# Patient Record
Sex: Male | Born: 1985
Health system: Southern US, Community
[De-identification: ages and names within clinical notes are randomized; demographics above are authoritative.]

## PROBLEM LIST (undated history)

## (undated) DIAGNOSIS — R569 Unspecified convulsions: Secondary | ICD-10-CM

## (undated) DIAGNOSIS — G4733 Obstructive sleep apnea (adult) (pediatric): Secondary | ICD-10-CM

## (undated) DIAGNOSIS — Z9989 Dependence on other enabling machines and devices: Secondary | ICD-10-CM

## (undated) DIAGNOSIS — I10 Essential (primary) hypertension: Secondary | ICD-10-CM

## (undated) HISTORY — PX: NO PAST SURGERIES: SHX2092

---

## 2018-01-18 ENCOUNTER — Encounter (HOSPITAL_COMMUNITY): Payer: Self-pay | Admitting: Neurology

## 2018-01-18 ENCOUNTER — Other Ambulatory Visit: Payer: Self-pay

## 2018-01-18 ENCOUNTER — Observation Stay (HOSPITAL_COMMUNITY)
Admission: EM | Admit: 2018-01-18 | Discharge: 2018-01-19 | Disposition: A | Discharge status: Home / Self Care | Payer: BLUE CROSS/BLUE SHIELD | Attending: Internal Medicine | Admitting: Internal Medicine

## 2018-01-18 ENCOUNTER — Observation Stay (HOSPITAL_COMMUNITY): Payer: BLUE CROSS/BLUE SHIELD

## 2018-01-18 DIAGNOSIS — Z79899 Other long term (current) drug therapy: Secondary | ICD-10-CM | POA: Insufficient documentation

## 2018-01-18 DIAGNOSIS — R52 Pain, unspecified: Secondary | ICD-10-CM

## 2018-01-18 DIAGNOSIS — R569 Unspecified convulsions: Principal | ICD-10-CM

## 2018-01-18 HISTORY — DX: Unspecified convulsions: R56.9

## 2018-01-18 HISTORY — DX: Dependence on other enabling machines and devices: Z99.89

## 2018-01-18 HISTORY — DX: Obstructive sleep apnea (adult) (pediatric): G47.33

## 2018-01-18 LAB — URINALYSIS, ROUTINE W REFLEX MICROSCOPIC
Bacteria, UA: NONE SEEN
Bilirubin Urine: NEGATIVE
Glucose, UA: NEGATIVE mg/dL
Hgb urine dipstick: NEGATIVE
Ketones, ur: NEGATIVE mg/dL
Leukocytes, UA: NEGATIVE
Nitrite: NEGATIVE
Protein, ur: 100 mg/dL — AB
Specific Gravity, Urine: 1.018 (ref 1.005–1.030)
pH: 6 (ref 5.0–8.0)

## 2018-01-18 LAB — COMPREHENSIVE METABOLIC PANEL
ALT: 53 U/L — ABNORMAL HIGH (ref 0–44)
AST: 38 U/L (ref 15–41)
Albumin: 4 g/dL (ref 3.5–5.0)
Alkaline Phosphatase: 64 U/L (ref 38–126)
Anion gap: 10 (ref 5–15)
BUN: 6 mg/dL (ref 6–20)
CO2: 24 mmol/L (ref 22–32)
Calcium: 9.3 mg/dL (ref 8.9–10.3)
Chloride: 104 mmol/L (ref 98–111)
Creatinine, Ser: 0.69 mg/dL (ref 0.61–1.24)
GFR calc Af Amer: >60 mL/min (ref >60)
GFR calc non Af Amer: >60 mL/min (ref >60)
Glucose, Bld: 120 mg/dL — ABNORMAL HIGH (ref 70–99)
Potassium: 3.7 mmol/L (ref 3.5–5.1)
Sodium: 138 mmol/L (ref 135–145)
Total Bilirubin: 0.5 mg/dL (ref 0.3–1.2)
Total Protein: 7.5 g/dL (ref 6.5–8.1)

## 2018-01-18 LAB — CBC WITH DIFFERENTIAL/PLATELET
Abs Immature Granulocytes: 0.06 10*3/uL (ref 0.00–0.07)
Basophils Absolute: 0 10*3/uL (ref 0.0–0.1)
Basophils Relative: 0 %
Eosinophils Absolute: 0.1 10*3/uL (ref 0.0–0.5)
Eosinophils Relative: 1 %
HCT: 42.6 % (ref 39.0–52.0)
Hemoglobin: 13.6 g/dL (ref 13.0–17.0)
Immature Granulocytes: 1 %
Lymphocytes Relative: 13 %
Lymphs Abs: 1.3 10*3/uL (ref 0.7–4.0)
MCH: 26.2 pg (ref 26.0–34.0)
MCHC: 31.9 g/dL (ref 30.0–36.0)
MCV: 82.1 fL (ref 80.0–100.0)
Monocytes Absolute: 0.5 10*3/uL (ref 0.1–1.0)
Monocytes Relative: 5 %
Neutro Abs: 7.9 10*3/uL — ABNORMAL HIGH (ref 1.7–7.7)
Neutrophils Relative %: 80 %
Platelets: 367 10*3/uL (ref 150–400)
RBC: 5.19 MIL/uL (ref 4.22–5.81)
RDW: 12.7 % (ref 11.5–15.5)
WBC: 10 10*3/uL (ref 4.0–10.5)
nRBC: 0 % (ref 0.0–0.2)

## 2018-01-18 LAB — RAPID URINE DRUG SCREEN, HOSP PERFORMED
Amphetamines: NOT DETECTED
Barbiturates: NOT DETECTED
Benzodiazepines: NOT DETECTED
Cocaine: NOT DETECTED
Opiates: NOT DETECTED
Tetrahydrocannabinol: NOT DETECTED

## 2018-01-18 LAB — CBG MONITORING, ED: Glucose-Capillary: 124 mg/dL — ABNORMAL HIGH (ref 70–99)

## 2018-01-18 LAB — MAGNESIUM: Magnesium: 2.1 mg/dL (ref 1.7–2.4)

## 2018-01-18 MED ORDER — LORAZEPAM 2 MG/ML IJ SOLN
INTRAMUSCULAR | Status: AC
  Filled 2018-01-18: qty 1

## 2018-01-18 MED ORDER — LACOSAMIDE 50 MG PO TABS
300.0000 mg | ORAL_TABLET | Freq: Two times a day (BID) | ORAL | Status: DC
  Administered 2018-01-18 – 2018-01-19 (×2): 300 mg via ORAL
  Filled 2018-01-18 (×2): qty 1

## 2018-01-18 MED ORDER — CLONAZEPAM 0.5 MG PO TABS: 0.5000 mg | ORAL_TABLET | Freq: Two times a day (BID) | ORAL | Status: DC

## 2018-01-18 MED ORDER — ACETAMINOPHEN 650 MG RE SUPP: 650.0000 mg | Freq: Four times a day (QID) | RECTAL | Status: DC | PRN

## 2018-01-18 MED ORDER — AMMONIA AROMATIC IN INHA
0.3000 mL | Freq: Once | RESPIRATORY_TRACT | Status: DC
  Filled 2018-01-18: qty 10

## 2018-01-18 MED ORDER — CLONAZEPAM 0.5 MG PO TABS
0.5000 mg | ORAL_TABLET | Freq: Two times a day (BID) | ORAL | Status: DC
  Administered 2018-01-18 – 2018-01-19 (×3): 0.5 mg via ORAL
  Filled 2018-01-18 (×3): qty 1

## 2018-01-18 MED ORDER — ADULT MULTIVITAMIN W/MINERALS CH
1.0000 | ORAL_TABLET | Freq: Every day | ORAL | Status: DC
  Administered 2018-01-19: 1 via ORAL
  Filled 2018-01-18 (×3): qty 1

## 2018-01-18 MED ORDER — LORAZEPAM 2 MG/ML IJ SOLN
INTRAMUSCULAR | Status: AC
  Administered 2018-01-18: 2 mg
  Filled 2018-01-18: qty 1

## 2018-01-18 MED ORDER — LACTATED RINGERS IV SOLN
INTRAVENOUS | Status: DC
  Administered 2018-01-18 – 2018-01-19 (×3): via INTRAVENOUS

## 2018-01-18 MED ORDER — LEVETIRACETAM 750 MG PO TABS
2500.0000 mg | ORAL_TABLET | Freq: Two times a day (BID) | ORAL | Status: DC
  Administered 2018-01-18 – 2018-01-19 (×2): 2500 mg via ORAL
  Filled 2018-01-18 (×2): qty 3

## 2018-01-18 MED ORDER — ACETAMINOPHEN 325 MG PO TABS
650.0000 mg | ORAL_TABLET | Freq: Four times a day (QID) | ORAL | Status: DC | PRN
  Administered 2018-01-18 – 2018-01-19 (×2): 650 mg via ORAL
  Filled 2018-01-18 (×2): qty 2

## 2018-01-18 MED ORDER — LORAZEPAM 2 MG/ML IJ SOLN: 2.0000 mg | Freq: Four times a day (QID) | INTRAMUSCULAR | Status: DC | PRN

## 2018-01-18 MED ORDER — SODIUM CHLORIDE 0.9 % IV SOLN
2000.0000 mg | INTRAVENOUS | Status: AC
  Administered 2018-01-18: 2000 mg via INTRAVENOUS
  Filled 2018-01-18: qty 20

## 2018-01-18 MED ORDER — ONDANSETRON HCL 4 MG/2ML IJ SOLN
4.0000 mg | Freq: Four times a day (QID) | INTRAMUSCULAR | Status: DC | PRN
  Administered 2018-01-18: 4 mg via INTRAVENOUS
  Filled 2018-01-18: qty 2

## 2018-01-18 MED ORDER — LACOSAMIDE 50 MG PO TABS: 300.0000 mg | ORAL_TABLET | Freq: Two times a day (BID) | ORAL | Status: DC

## 2018-01-18 MED ORDER — POLYETHYLENE GLYCOL 3350 17 G PO PACK: 17.0000 g | PACK | Freq: Every day | ORAL | Status: DC | PRN

## 2018-01-18 MED ORDER — ENOXAPARIN SODIUM 40 MG/0.4ML ~~LOC~~ SOLN
40.0000 mg | SUBCUTANEOUS | Status: DC
  Filled 2018-01-18: qty 0.4

## 2018-01-18 MED ORDER — LEVETIRACETAM 500 MG PO TABS: 2500.0000 mg | ORAL_TABLET | Freq: Two times a day (BID) | ORAL | Status: DC

## 2018-01-18 NOTE — Consult Note (Addendum)
Neurology Consultation  Reason for Consult: Seizure Referring Physician: Juleen China   History is obtained from: Wife  HPI: Jonathan Hartman is a 32 y.o. male with history of seizure.  Per wife patient has been doing well on Keppra 5000 mg twice daily and Vimpat 200 mg twice daily.  Last seizure was approximately 3 years ago.  Most seizures are usually complex partial however he has had intermittent tonic-clonic.  The last 2 seizures occurred this morning at 10:00 and then approximately 1145 while in the ED and both were tonic-clonic seizures with postictal period of time.  His postictal.  Per wife can last from 30 minutes to an hour and often is confusion and being uncomfortable.  Currently he is able to answer questions but very drowsy and moving around the bed in an uncomfortable manner.  Patient apparently had a seizure at 10:00 this morning.  Wife states he was on his belly looking at his phone when suddenly he had his hands on his head and was shaking his head back and forth and became stiff.  While in the ED the seizure was noted by the RN and stated that his arms were above his head legs became stiff along with his arms.  As stated he is very compliant with his medications per wife however he has been under some stress lately as he was promoted in his job and his wife is in speech therapy school and not home on whole lot.  It was noted that patient's heart rate went up to 150 bpm during the seizure in addition to elevated blood pressure  ED course --patient has had 4 mg of Ativan thus far and also taking his home medication of 2000 mg Keppra.   Patient does see a neurologist at [facility] and actually has a appointment this Tuesday.   ROS:  Unable to obtain due to altered mental status.   Past Medical History:  Diagnosis Date  . Seizure [facility])      Family History  Problem Relation Age of Onset  . Hypertension Mother   . Hypertension Father      Social History:   has no tobacco, alcohol,  and drug history on file.  Medications  Current Facility-Administered Medications:  .  ammonia inhalant 0.3 mL, 0.3 mL, Inhalation, Once, Raeford Razor, MD .  levETIRAcetam (KEPPRA) 2,000 mg in sodium chloride 0.9 % 100 mL IVPB, 2,000 mg, Intravenous, STAT, Raeford Razor, MD .  LORazepam (ATIVAN) 2 MG/ML injection, , , ,  .  LORazepam (ATIVAN) 2 MG/ML injection, , , ,  .  LORazepam (ATIVAN) 2 MG/ML injection, , , ,   Current Outpatient Medications:  .  ibuprofen (ADVIL,MOTRIN) 200 MG tablet, Take 400 mg by mouth every 6 (six) hours as needed for headache., Disp: , Rfl:  .  Lacosamide 100 MG TABS, Take 300 mg by mouth 2 (two) times daily., Disp: , Rfl:  .  levETIRAcetam (KEPPRA) 500 MG tablet, Take 2,500 mg by mouth 2 (two) times daily., Disp: , Rfl:  .  Multiple Vitamin (MULTIVITAMIN) capsule, Take 1 capsule by mouth daily., Disp: , Rfl:    Exam: Current vital signs: BP 140/88 (BP Location: Right Arm)   Pulse 98   Temp 98.3 F (36.8 C) (Oral)   Resp 15   Ht 5\' 10"  (1.778 m)   Wt (!) 158.8 kg   SpO2 96%   BMI 50.22 kg/m  Vital signs in last 24 hours: Temp:  [98.3 F (36.8 C)] 98.3 F (36.8  C) (10/31 1125) Pulse Rate:  [98] 98 (10/31 1125) Resp:  [15] 15 (10/31 1125) BP: (140)/(88) 140/88 (10/31 1125) SpO2:  [96 %] 96 % (10/31 1125) Weight:  [158.8 kg] 158.8 kg (10/31 1125)  Physical Exam  Constitutional: Appears well-developed and well-nourished.  Psych: Affect appropriate to situation Eyes: No scleral injection HENT: No OP obstrucion Head: Normocephalic.  Cardiovascular: Normal rate and regular rhythm.  Respiratory: Effort normal, non-labored breathing GI: Soft.  No distension. There is no tenderness.  Skin: WDI  Neuro: Mental Status: Patient currently is drowsy, moving around in the bed looks uncomfortable.  He is able to answer my questions such as he does know that he is at the hospital, the month is October, and Halloween is today.  He is able to follow  commands such as squeezing my hand and counting my fingers Cranial Nerves: II: Visual Fields are full. Pupils are equal, round, and reactive to light.   III,IV, VI: EOMI without ptosis or diploplia.  V: Facial sensation is symmetric to temperature VII: Facial movement is symmetric.  VIII: hearing is intact to voice X: Uvula elevates symmetrically XI: Shoulder shrug is symmetric. XII: tongue is midline without atrophy or fasciculations.  He has significantly bitten the tip of his tongue and also the right vermilion border Motor: Tone is normal. Bulk is normal. 5/5 strength was present in all four extremities.  Sensory: Sensation is symmetric to light touch and temperature in the arms and legs. Deep Tendon Reflexes: 2+ and symmetric in the biceps and patellae.  Plantars: Toes are downgoing bilaterally.  Cerebellar: Not able to examine     Labs I have reviewed labs in epic and the results pertinent to this consultation are:   CBC    Component Value Date/Time   WBC 10.0 01/18/2018 1158   RBC 5.19 01/18/2018 1158   HGB 13.6 01/18/2018 1158   HCT 42.6 01/18/2018 1158   PLT 367 01/18/2018 1158   MCV 82.1 01/18/2018 1158   MCH 26.2 01/18/2018 1158   MCHC 31.9 01/18/2018 1158   RDW 12.7 01/18/2018 1158   LYMPHSABS 1.3 01/18/2018 1158   MONOABS 0.5 01/18/2018 1158   EOSABS 0.1 01/18/2018 1158   BASOSABS 0.0 01/18/2018 1158    CMP     Component Value Date/Time   NA 138 01/18/2018 1158   K 3.7 01/18/2018 1158   CL 104 01/18/2018 1158   CO2 24 01/18/2018 1158   GLUCOSE 120 (H) 01/18/2018 1158   BUN 6 01/18/2018 1158   CREATININE 0.69 01/18/2018 1158   CALCIUM 9.3 01/18/2018 1158   PROT 7.5 01/18/2018 1158   ALBUMIN 4.0 01/18/2018 1158   AST 38 01/18/2018 1158   ALT 53 (H) 01/18/2018 1158   ALKPHOS 64 01/18/2018 1158   BILITOT 0.5 01/18/2018 1158   GFRNONAA >60 01/18/2018 1158   GFRAA >60 01/18/2018 1158    Lipid Panel  No results found for: CHOL, TRIG, HDL,  CHOLHDL, VLDL, LDLCALC, LDLDIRECT   Imaging CT head has been ordered  Felicie Morn PA-C [facility] [phone]  M-F  (9:00 am- 5:00 PM)  01/18/2018, 1:15 PM   I have seen the patient reviewed the above note.  Assessment:  32 year old male presenting with 2 breakthrough seizures.  Patient has been stable on his home medications for at least 3 years.  Etiology of the breakthrough seizures at this time is unclear, but they did happen after delaying his morning medications by sleeping and.  With him on maximal doses of  2 medications and the good control previously, I am hesitant to add a third medication at this time.  If he has any further episodes from here on then I would favor adding a third antiepileptic.  Because he has had multiple seizures, I think that using benzodiazepines for a couple of days to help raise the seizure threshold temporarily may be a good option.  Recommendations: - 0.5 mg Klonopin twice daily for 3 days - Admit to hospital with seizure precautions - Have family call primary neurologist as he has an appointment this Tuesday possibly they can make that appointment earlier if he is back to baseline by tomorrow. -restart home medications   Also, Maintain good sleep hygiene. Avoid alcohol.  Ritta Slot, MD [facility] [phone]  If 7pm- 7am, please page neurology on call as listed in [facility]ION.

## 2018-01-18 NOTE — ED Notes (Addendum)
Pt family reported pt was having seizure. RN, student and PA entered room to find pt actively seizing. Blood found running from mouth, skin color was ashen gray, oral suctioning performed to clear airway. Seizure lasted 60-90 seconds. PA performed jaw thrust to open airway. Laceration noted to tongue. MD reported to bedside. Following sz activity, pt became post-ictal for a short time before becoming combative. ED personnel assisted with restraining pt for safety. EDP verbalized order for soft restraints.

## 2018-01-18 NOTE — ED Notes (Signed)
RN attempted report 

## 2018-01-18 NOTE — ED Notes (Signed)
Soft restraints applied and secured with quick release knots. Pt was at risk for self harm due to flailing.

## 2018-01-18 NOTE — ED Triage Notes (Signed)
Pt arrived via [facility] from home after family reported pt having a grand mal seizure lasting a stated 3 mins. EMS reported post ictal state upon arrival to scene. Pt has hx of seizures and takes medication daily. Min or trauma noted to tip of tongue, no bleeding at time of triage. Pt is alert and oriented but does not remember actual event.

## 2018-01-18 NOTE — ED Notes (Signed)
X-ray at bedside

## 2018-01-18 NOTE — Progress Notes (Signed)
Pt has home CPAP at bedside with sterile water in chamber.

## 2018-01-18 NOTE — ED Notes (Signed)
Informed pt of need for urine sample. Pt states he is unable to provide sample at this time. Given urinal and informed to notify staff when he was able to go.

## 2018-01-18 NOTE — ED Notes (Signed)
Kirkpatrick, MD at bedside.  

## 2018-01-18 NOTE — ED Provider Notes (Signed)
[facility] EMERGENCY DEPARTMENT Provider Note   CSN: [id number] Arrival date & time: 01/18/18  1125     History   Chief Complaint Chief Complaint  Patient presents with  . Seizures    HPI Jonathan Hartman is a 32 y.o. male senting for evaluation of seizure.  Patient states he had a seizure around 12/1013 this morning.  He does not remember the event, but his fiance does.  His fiance states he was laying in bed when he she noticed he dropped his phone and went rigid.  He had uncontrolled movement of his upper and lower extremities for approximately 1 minute.  During this time he had urinary incontinence.  He had a postictal state that lasted about 10 to 15 minutes, during which he was very combative.  He had no fall or injury.  She states he has a history of seizures, is on Keppra and Vimpat.  Has been taking medication as prescribed.  He did not have it yet this morning prior to the episode.  Patient follows with [facility] neurology, has an appointment on Tuesday.  He has not had a grand mal seizure for several years.  Currently, patient denies any pain.  Denies recent fevers, chills, cough, chest pain, shortness of breath, nausea, vomiting, abdominal pain, urinary symptoms, normal bowel movements.  He has no other medical problems.  There has been no trigger identified for his seizures.  Patient states that prior to having a seizure, he notices an aura, as he did today.  HPI  Past Medical History:  Diagnosis Date  . Seizure [facility])     Patient Active Problem List   Diagnosis Date Noted  . Seizure (HCC) 01/18/2018    Home Medications    Prior to Admission medications   Medication Sig Start Date End Date Taking? Authorizing Provider  ibuprofen (ADVIL,MOTRIN) 200 MG tablet Take 400 mg by mouth every 6 (six) hours as needed for headache.   Yes [provider]  Lacosamide 100 MG TABS Take 300 mg by mouth 2 (two) times daily. 10/27/17 10/27/18 Yes [provider]  levETIRAcetam (KEPPRA) 500 MG tablet Take 2,500 mg by mouth 2 (two) times daily. 10/27/17 10/27/18 Yes [provider]  Multiple Vitamin (MULTIVITAMIN) capsule Take 1 capsule by mouth daily.   Yes [provider]    Family History Family History  Problem Relation Age of Onset  . Hypertension Mother   . Hypertension Father     Social History Social History   Tobacco Use  . Smoking status: Not on file  Substance Use Topics  . Alcohol use: Not on file  . Drug use: Not on file     Allergies   Patient has no known allergies.   Review of Systems Review of Systems  Neurological: Positive for seizures.  All other systems reviewed and are negative.    Physical Exam Updated Vital Signs BP 137/88   Pulse 93   Temp 98.3 F (36.8 C) (Oral)   Resp 19   Ht 5\' 10"  (1.778 m)   Wt (!) 158.8 kg   SpO2 98%   BMI 50.22 kg/m   Physical Exam  Constitutional: He is oriented to person, place, and time. He appears well-developed and well-nourished. No distress.  Young male resting comfortably in the bed in no acute distress  HENT:  Head: Normocephalic and atraumatic.  Eyes: Pupils are equal, round, and reactive to light. Conjunctivae and EOM are normal.  Neck: Normal range of motion.  Neck supple.  Cardiovascular: Normal rate, regular rhythm and intact distal pulses.  Pulmonary/Chest: Effort normal and breath sounds normal. No respiratory distress. He has no wheezes.  Abdominal: Soft. He exhibits no distension and no mass. There is no tenderness. There is no guarding.  Musculoskeletal: Normal range of motion.  Strength intact x4.  Sensation intact x4.  Radial and pedal pulses intact bilaterally.  No deformity noted.  Neurological: He is alert and oriented to person, place, and time.  No neurologic deficits.  CN intact.  Nose to finger intact.  Grip strength intact.  Fine movement and coordination intact.  Patellar reflexes intact.  Skin: Skin is warm  and dry. Capillary refill takes less than 2 seconds.  Psychiatric: He has a normal mood and affect.  Nursing note and vitals reviewed.    ED Treatments / Results  Labs (all labs ordered are listed, but only abnormal results are displayed) Labs Reviewed  CBC WITH DIFFERENTIAL/PLATELET - Abnormal; Notable for the following components:      Result Value   Neutro Abs 7.9 (*)    All other components within normal limits  COMPREHENSIVE METABOLIC PANEL - Abnormal; Notable for the following components:   Glucose, Bld 120 (*)    ALT 53 (*)    All other components within normal limits  CBG MONITORING, ED - Abnormal; Notable for the following components:   Glucose-Capillary 124 (*)    All other components within normal limits  MAGNESIUM  URINALYSIS, ROUTINE W REFLEX MICROSCOPIC  RAPID URINE DRUG SCREEN, HOSP PERFORMED  HIV ANTIBODY (ROUTINE TESTING W REFLEX)    EKG EKG Interpretation  Date/Time:  Thursday January 18 2018 11:26:58 EDT Ventricular Rate:  96 PR Interval:    QRS Duration: 100 QT Interval:  356 QTC Calculation: 450 R Axis:   12 Text Interpretation:  Sinus rhythm RSR' in V1 or V2, right VCD or RVH Confirmed by Raeford Razor [phone]) on 01/18/2018 12:51:32 PM   Radiology Dg Shoulder Left Port  Result Date: 01/18/2018 CLINICAL DATA:  Larey Seat today while having seizure, left shoulder pain EXAM: LEFT SHOULDER - 1 VIEW COMPARISON:  None. FINDINGS: On the single portable view obtained, the left humeral head appears to be in normal position. No fracture is seen. The left AC joint appears normally aligned. IMPRESSION: Negative portable left shoulder film. Electronically Signed   By: Dwyane Dee M.D.   On: 01/18/2018 15:28    Procedures .Critical Care Performed by: Alveria Apley, PA-C Authorized by: Alveria Apley, PA-C   Critical care provider statement:    Critical care time (minutes):  55   Critical care time was exclusive of:  Separately billable procedures and  treating other patients and teaching time   Critical care was necessary to treat or prevent imminent or life-threatening deterioration of the following conditions:  CNS failure or compromise   Critical care was time spent personally by me on the following activities:  Blood draw for specimens, development of treatment plan with patient or surrogate, discussions with consultants, evaluation of patient's response to treatment, examination of patient, obtaining history from patient or surrogate, ordering and performing treatments and interventions, ordering and review of laboratory studies, ordering and review of radiographic studies, pulse oximetry, re-evaluation of patient's condition and review of old charts   I assumed direction of critical care for this patient from another provider in my specialty: no   Comments:     Pt with witnessed seizure and post-ictal state lasting >20 minutes, requiring multiple rounds of  benzodiazepines and airway management with NRB and positioning after desaturation   (including critical care time)  Medications Ordered in ED Medications  ammonia inhalant 0.3 mL (0.3 mLs Inhalation Not Given 01/18/18 1344)  clonazePAM (KLONOPIN) tablet 0.5 mg (0.5 mg Oral Given 01/18/18 1404)  multivitamin with minerals tablet 1 tablet (has no administration in time range)  enoxaparin (LOVENOX) injection 40 mg (has no administration in time range)  lactated ringers infusion (has no administration in time range)  acetaminophen (TYLENOL) tablet 650 mg (has no administration in time range)    Or  acetaminophen (TYLENOL) suppository 650 mg (has no administration in time range)  polyethylene glycol (MIRALAX / GLYCOLAX) packet 17 g (has no administration in time range)  LORazepam (ATIVAN) injection 2 mg (has no administration in time range)  ondansetron (ZOFRAN) injection 4 mg (4 mg Intravenous Given 01/18/18 1533)  lacosamide (VIMPAT) tablet 300 mg (has no administration in time range)    levETIRAcetam (KEPPRA) tablet 2,500 mg (has no administration in time range)  LORazepam (ATIVAN) 2 MG/ML injection (2 mg  Given 01/18/18 1226)  LORazepam (ATIVAN) 2 MG/ML injection (2 mg  Given 01/18/18 1232)  levETIRAcetam (KEPPRA) 2,000 mg in sodium chloride 0.9 % 100 mL IVPB (0 mg Intravenous Stopped 01/18/18 1253)     Initial Impression / Assessment and Plan / ED Course  I have reviewed the triage vital signs and the nursing notes.  Pertinent labs & imaging results that were available during my care of the patient were reviewed by me and considered in my medical decision making (see chart for details).     Pt presenting for evaluation after seizure.  Physical exam reassuring, no obvious neurologic deficits.  He is acting at his baseline.  Will obtain labs and urine for evaluation to rule out triggers such as infection or electrolyte abnormality. Pt took his PO keppra and vimpat.   EKG without stemi. Labs pending.   Called by RN that pt is having sz.  Upon arrival to the room, patient is tachycardic, diaphoretic, with cyanosis of the lips.  Patient placed in chin thrust position, oxygen started with nonrebreather. Stats returned to upper 90's from 70%. HR elevated around 150. Pt bit tongue. No incontinence.  Pt without tonic-clonic movement at this time.  Attending informed of situation, and evaluated and assisted in management.  2 mg ativan given IV.  Patient remains postictal and combative, took out his IV. 2 mg ativan given IM while new IV started. Keppra 2000 mg started IV. Pt remained combative, restraints placed for patient and staff safely.  Pt remained postictal for >20 minutes. HR gradually improved from to 110. Diaphoresis improved. Pt gradually became less combative. Neurology consulted.   Neuro recommends overnight obvs to hospitalist service. Continue home regimen and add klonipin bid. If pt remains sz free overnight, plan for d/c and f/u with OP neurology.  Labs reassuring,  no electrolyte abnormality. hgb stable. Doubt infectious trigger.   Pt to be placed on sz precautions: no driving for 6 months, no operating heavy machinery, avoid ladders/heights, do not swim alone, sit while holding infants.  Discussed with hospitalist, pt to be admitted to [facility] service.   Final Clinical Impressions(s) / ED Diagnoses   Final diagnoses:  Seizure [facility])    ED Discharge Orders    None       Alveria Apley, PA-C 01/18/18 1605    Raeford Razor, MD 01/18/18 [phone]

## 2018-01-18 NOTE — H&P (Addendum)
History and Physical    DOA: 01/18/2018  PCP: System, Pcp Not In  Patient coming from: Home  Chief Complaint: Seizures  HPI: Jonathan Hartman is a 32 y.o. male with history h/o seizure disorder who follows [facility] neurology presented this morning with a breakthrough seizure after being seizure-free for 3 years.  Patient lives with Jonathan fiance who is bedside and provides most of the history as patient sedated with Ativan.  Patient apparently takes Jonathan medications religiously.  This morning he woke up late and suffered a seizure around 10 AM before he could take Jonathan morning medications.  Jonathan Hartman describes it as tonic-clonic seizures lasting about a minute followed by an hour-long postictal state.  Patient was observed in the ED, Jonathan condition improved and he was talking with staff, I appeared normal and before he could be discharged had another tonic-clonic seizure in the ED.  Patient was seen by neurology and recommended to admit for observation with continuation of Jonathan home medications and addition of Klonopin twice daily.  Patient so far has received 4 mg of IV Ativan in the ED.   Review of Systems: As per HPI otherwise 10 point review of systems negative.    Past Medical History:  Diagnosis Date  . Seizure [facility])     Social history:  has no tobacco, alcohol, and drug history on file.   No Known Allergies  Family History  Problem Relation Age of Onset  . Hypertension Jonathan Hartman   . Hypertension Jonathan Hartman    Jonathan Hartman passed away in 2019/08/21with pancreatic cancer and complications.  History of diabetes and dad.  Jonathan Hartman apparently had postmenopausal seizures and grandfather died of prostate cancer   Prior to Admission medications   Medication Sig Start Date End Date Taking? Authorizing Provider  ibuprofen (ADVIL,MOTRIN) 200 MG tablet Take 400 mg by mouth every 6 (six) hours as needed for headache.   Yes [provider]  Lacosamide 100 MG TABS Take 300 mg by mouth 2  (two) times daily. 10/27/17 11/09/2018 Yes [provider]  levETIRAcetam (KEPPRA) 500 MG tablet Take 2,500 mg by mouth 2 (two) times daily. 10/27/17 09-Nov-2018 Yes [provider]  Multiple Vitamin (MULTIVITAMIN) capsule Take 1 capsule by mouth daily.   Yes [provider]    Physical Exam: Vitals:   01/18/18 1125 01/18/18 1315  BP: 140/88 (!) 148/94  Pulse: 98 (!) 103  Resp: 15 (!) 38  Temp: 98.3 F (36.8 C)   TempSrc: Oral   SpO2: 96% 91%  Weight: (!) 158.8 kg   Height: 5\' 10"  (1.778 m)     Constitutional: NAD, calm, comfortable Vitals:   01/18/18 1125 01/18/18 1315  BP: 140/88 (!) 148/94  Pulse: 98 (!) 103  Resp: 15 (!) 38  Temp: 98.3 F (36.8 C)   TempSrc: Oral   SpO2: 96% 91%  Weight: (!) 158.8 kg   Height: 5\' 10"  (1.778 m)    Eyes: PERRL, lids and conjunctivae normal ENMT: Mucous membranes are moist. Posterior pharynx clear of any exudate or lesions.Normal dentition.  Neck: normal, supple, no masses, no thyromegaly Respiratory: clear to auscultation bilaterally, no wheezing, no crackles. Normal respiratory effort. No accessory muscle use.  Cardiovascular: Regular rate and rhythm, no murmurs / rubs / gallops. No extremity edema. 2+ pedal pulses. No carotid bruits.  Abdomen: no tenderness, no masses palpated. No hepatosplenomegaly. Bowel sounds positive.  Musculoskeletal: no clubbing / cyanosis. No joint deformity upper and lower extremities.  Decreased range of motion  along the left shoulder joint due to pain, no contractures. Normal muscle tone.  Neurologic: CN 2-12 grossly intact. Sensation intact, DTR normal. Strength 5/5 in all 4.  Psychiatric: Normal judgment and insight. Alert and oriented x 3. Normal mood.  SKIN/catheters: no rashes, lesions, ulcers. No induration  Labs on Admission: I have personally reviewed following labs and imaging studies  CBC: Recent Labs  Lab 01/18/18 1158  WBC 10.0  NEUTROABS 7.9*  HGB 13.6  HCT 42.6  MCV 82.1    PLT 367   Basic Metabolic Panel: Recent Labs  Lab 01/18/18 1158  NA 138  K 3.7  CL 104  CO2 24  GLUCOSE 120*  BUN 6  CREATININE 0.69  CALCIUM 9.3  MG 2.1   GFR: Estimated Creatinine Clearance: 204.9 mL/min (by C-G formula based on SCr of 0.69 mg/dL). Liver Function Tests: Recent Labs  Lab 01/18/18 1158  AST 38  ALT 53*  ALKPHOS 64  BILITOT 0.5  PROT 7.5  ALBUMIN 4.0   No results for input(s): LIPASE, AMYLASE in the last 168 hours. No results for input(s): AMMONIA in the last 168 hours. Coagulation Profile: No results for input(s): INR, PROTIME in the last 168 hours. Cardiac Enzymes: No results for input(s): CKTOTAL, CKMB, CKMBINDEX, TROPONINI in the last 168 hours. BNP (last 3 results) No results for input(s): PROBNP in the last 8760 hours. HbA1C: No results for input(s): HGBA1C in the last 72 hours. CBG: Recent Labs  Lab 01/18/18 1222  GLUCAP 124*   Lipid Profile: No results for input(s): CHOL, HDL, LDLCALC, TRIG, CHOLHDL, LDLDIRECT in the last 72 hours. Thyroid Function Tests: No results for input(s): TSH, T4TOTAL, FREET4, T3FREE, THYROIDAB in the last 72 hours. Anemia Panel: No results for input(s): VITAMINB12, FOLATE, FERRITIN, TIBC, IRON, RETICCTPCT in the last 72 hours. Urine analysis: No results found for: COLORURINE, APPEARANCEUR, LABSPEC, PHURINE, GLUCOSEU, HGBUR, BILIRUBINUR, KETONESUR, PROTEINUR, UROBILINOGEN, NITRITE, LEUKOCYTESUR  Radiological Exams on Admission: No results found.      Assessment and Plan:   This is a 32 year old male who is pretty compliant with Jonathan seizure medications presenting with breakthrough seizures.  Patient evaluated by neurology in the ED and recommended admission for observation.  Patient beginning to wake up and will likely be able to take oral meds.  Will resume Keppra and Vimpat at home dose.  Will add Klonopin as recommended by neurology.  Will defer imaging studies unless advised by neurology.  Given  decreased range of motion along left shoulder joint will obtain x-ray to rule out dislocation.  DVT prophylaxis: Lovenox  Code Status: Full code  Family Communication: Discussed with patient. Health care proxy would be fianc and sister Consults called: Neurology Admission status:  Patient admitted as observation as anticipated LOS less than 2 midnights    Alessandra Bevels MD [facility] Pager [phone]  If 7PM-7AM, please contact night-coverage [email or web address] Password [email or web address]  01/18/2018, 3:02 PM

## 2018-01-18 NOTE — ED Provider Notes (Signed)
Medical screening examination/treatment/procedure(s) were performed by non-physician practitioner and as supervising physician I was immediately available for consultation/collaboration.  EKG Interpretation  Date/Time:  Thursday January 18 2018 11:26:58 EDT Ventricular Rate:  96 PR Interval:    QRS Duration: 100 QT Interval:  356 QTC Calculation: 450 R Axis:   12 Text Interpretation:  Sinus rhythm RSR' in V1 or V2, right VCD or RVH Confirmed by Raeford Razor [phone]) on 01/18/2018 12:51:70 PM     32 year old male with seizures.  From his wife's description, it sounds like he typically has complex partial seizures.  He has had generalized tonic-clonic seizures previously but not in several years.  He is followed by [facility] neurology.  He is on Keppra and Vimpat.  His wife reports that he takes his medications regularly.  He has seemed to be in his typical state of health over the past several days until he has a generalized seizure prior to arrival.  After arriving to the emergency room he improved back to his baseline.  He subsequently had another generalized seizure.  On my evaluation he had snoring respirations and was bleeding from his tongue.  He did desat to the 60s briefly which improved quickly with supplemental oxygen.  He was given 2 mg of Ativan IV. Glucose 124.  He was extremely combative while postictal.  Even when being manually restrained by several staff members he still managed to pull out his IV.  He was given an additional 2 mg of Ativan IM. Keppra. Neurology consultation.   CRITICAL CARE Performed by: Raeford Razor Total critical care time: 35 minutes Critical care time was exclusive of separately billable procedures and treating other patients. Critical care was necessary to treat or prevent imminent or life-threatening deterioration. Critical care was time spent personally by me on the following activities: development of treatment plan with patient and/or surrogate as well as  nursing, discussions with consultants, evaluation of patient's response to treatment, examination of patient, obtaining history from patient or surrogate, ordering and performing treatments and interventions, ordering and review of laboratory studies, ordering and review of radiographic studies, pulse oximetry and re-evaluation of patient's condition.    Raeford Razor, MD 01/18/18 1253

## 2018-01-18 NOTE — ED Notes (Signed)
Restraints removed. Pt no longer combative and becoming more alert. CMS intact.

## 2018-01-18 NOTE — ED Notes (Signed)
CMS checked distally to restraints. Pt remains combative at this time. Restraints to remain in place.

## 2018-01-19 ENCOUNTER — Observation Stay (HOSPITAL_COMMUNITY): Payer: BLUE CROSS/BLUE SHIELD

## 2018-01-19 DIAGNOSIS — R569 Unspecified convulsions: Secondary | ICD-10-CM

## 2018-01-19 LAB — HIV ANTIBODY (ROUTINE TESTING W REFLEX): HIV Screen 4th Generation wRfx: NONREACTIVE

## 2018-01-19 MED ORDER — CLONAZEPAM 0.5 MG PO TABS: 0.5000 mg | ORAL_TABLET | Freq: Two times a day (BID) | ORAL | 0 refills | Status: DC

## 2018-01-19 NOTE — Progress Notes (Addendum)
NEURO HOSPITALIST PROGRESS NOTE   Subjective: Patient awake, alert, at the sink brushing his teeth. NAD. Wife at bedside. No further seizures overnight. Denies missing any doses of medications. Did bite his tongue during seizure. Patient is back to baseline per wife.   Exam: Vitals:   01/19/18 0438 01/19/18 0836  BP: 116/65 131/87  Pulse: 74 76  Resp: 18 17  Temp:  98.1 F (36.7 C)  SpO2:  98%    Physical Exam   HEENT-  Normocephalic, no lesions, without obvious abnormality.  Normal external eye and conjunctiva.   Cardiovascular- S1-S2 audible, pulses palpable throughout   Lungs-no rhonchi or wheezing noted, no excessive working breathing.  Saturations within normal limits on RA. Abdomen- All 4 quadrants palpated and nontender Extremities- Warm, dry and intact Musculoskeletal-no joint tenderness, deformity or swelling Skin-warm and dry, no hyperpigmentation, vitiligo, or suspicious lesions   Neuro:  Mental Status: Alert, oriented/ person/place/age/year/month, thought content appropriate.  Speech fluent without evidence of aphasia.  Able to follow  commands without difficulty. Cranial Nerves: ZO:XWRUEA fields grossly normal,  III,IV, VI: ptosis not present, extra-ocular motions intact bilaterally pupils equal, round, reactive to light and accommodation V,VII: smile symmetric, facial light touch sensation normal bilaterally VIII: hearing normal bilaterally IX,X: uvula rises symmetrically XI: bilateral shoulder shrug XII: midline tongue extension Motor: Right : Upper extremity   5/5 Left:     Upper extremity   5/5  Lower extremity   5/5  Lower extremity   5/5 Tone and bulk:normal tone throughout; no atrophy noted Sensory:  light touch intact throughout, bilaterally Deep Tendon Reflexes: 2+ and symmetric biceps, patella Plantars: Right: downgoing   Left: downgoing Cerebellar: normal finger-to-nose, normal rapid alternating movements and normal  heel-to-shin test Gait: unsteady gait.    Medications:  Scheduled: . ammonia  0.3 mL Inhalation Once  . clonazePAM  0.5 mg Oral BID  . enoxaparin (LOVENOX) injection  40 mg Subcutaneous Q24H  . lacosamide  300 mg Oral BID  . levETIRAcetam  2,500 mg Oral BID  . multivitamin with minerals  1 tablet Oral Daily   Continuous: . lactated ringers 125 mL/hr at 01/19/18 0326   VWU:[id number] **OR** acetaminophen, LORazepam, ondansetron (ZOFRAN) IV, polyethylene glycol  Pertinent Labs/Diagnostics:   Ct Head Wo Contrast  Result Date: 01/19/2018 CLINICAL DATA:  Initial evaluation for acute encephalopathy, seizure. EXAM: CT HEAD WITHOUT CONTRAST TECHNIQUE: Contiguous axial images were obtained from the base of the skull through the vertex without intravenous contrast. COMPARISON:  None available. FINDINGS: Brain: Cerebral volume within normal limits for patient age. No evidence for acute intracranial hemorrhage. No findings to suggest acute large vessel territory infarct. No mass lesion, midline shift, or mass effect. Ventricles are normal in size without evidence for hydrocephalus. No extra-axial fluid collection identified. Vascular: No hyperdense vessel identified. Skull: Scalp soft tissues demonstrate no acute abnormality. Calvarium intact. Sinuses/Orbits: Globes and orbital soft tissues within normal limits. Retention cyst present within the right maxillary sinus. Paranasal sinuses are otherwise clear. No mastoid effusion. IMPRESSION: Normal head CT.  No acute intracranial abnormality identified. Electronically Signed   By: Rise Mu M.D.   On: 01/19/2018 01:59   Dg Shoulder Left Port  Result Date: 01/18/2018 CLINICAL DATA:  Larey Seat today while having seizure, left shoulder pain EXAM: LEFT SHOULDER - 1 VIEW COMPARISON:  None. FINDINGS: On the single portable view obtained, the left humeral  head appears to be in normal position. No fracture is seen. The left AC joint appears normally  aligned. IMPRESSION: Negative portable left shoulder film. Electronically Signed   By: Dwyane Dee M.D.   On: 01/18/2018 15:28   Assessment:  32 year old male presenting with 2 breakthrough seizures.  Patient has been stable on his home medications for at least 3 years.  Etiology of the breakthrough seizures at this time is unclear, but they did happen after delaying his morning medications by sleeping and with him on maximal doses of 2 medications and the good control previously, I am hesitant to add a third medication at this time.  If he has any further episodes from here on then I would favor adding a third antiepileptic. Normal head CT.  Because he has had multiple seizures, I think that using benzodiazepines for a couple of days to help raise the seizure threshold temporarily may be a good option.   Recommendations:  - 0.5 mg Klonopin twice daily for 3 days - Have family call primary neurologist as he has an appointment this Tuesday possibly they can make that appointment earlier if he is back to baseline by tomorrow. -restart home medications -neurology to sign-off at this time. Please page with  further questions.  Per [facility] statutes, patients with seizures are not allowed to drive until  they have been seizure-free for six months. Use caution when using heavy equipment or power tools. Avoid working on ladders or at heights. Take showers instead of baths. Ensure the water temperature is not too high on the home water heater. Do not go swimming alone. When caring for infants or small children, sit down when holding, feeding, or changing them to minimize risk of injury to the child in the event you have a seizure.   Also, Maintain good sleep hygiene. Avoid alcohol.  Valentina Lucks, MSN, NP-C [facility] [phone]   Attending neurologist's note to follow 01/19/2018, 9:54 AM   I have seen the patient reviewed the above note.  He is awake, alert, pleasant.   I have discussed no driving for 6 months with the patient and he expresses understanding.  He will call his outpatient neurologist for follow-up.  Ritta Slot, MD [facility] [phone]  If 7pm- 7am, please page neurology on call as listed in AMION.

## 2018-01-19 NOTE — Discharge Summary (Signed)
Physician Discharge Summary  Jonathan Hartman ZOX:[id number] DOB: January 08, 1986 DOA: 01/18/2018  PCP: Gaspar Garbe, MD  Admit date: 01/18/2018 Discharge date: 01/19/2018  Admitted From: home Discharge disposition: home   Recommendations for Outpatient Follow-Up:   1. Close follow up with neurology at [facility]   Discharge Diagnosis:   Active Problems:   Seizure [facility])    Discharge Condition: Improved.  Diet recommendation: Regular.  Wound care: None.  Code status: Full.   History of Present Illness:   Jonathan Hartman is a 32 y.o. male with history h/o seizure disorder who follows [facility] neurology presented this morning with a breakthrough seizure after being seizure-free for 3 years.  Patient lives with his fiance who is bedside and provides most of the history as patient sedated with Ativan.  Patient apparently takes his medications religiously.  This morning he woke up late and suffered a seizure around 10 AM before he could take his morning medications.  [name] describes it as tonic-clonic seizures lasting about a minute followed by an hour-long postictal state.  Patient was observed in the ED, his condition improved and he was talking with staff, I appeared normal and before he could be discharged had another tonic-clonic seizure in the ED.  Patient was seen by neurology and recommended to admit for observation with continuation of his home medications and addition of Klonopin twice daily.  Patient so far has received 4 mg of IV Ativan in the ED.    Hospital Course by Problem:   Seizures -on keppra and vimpat -seen by neurology -would add klonopin x 3 days BID and close follow up with neurology -If any further episodes, will need 3rd agent -seizure precautions   Medical Consultants:   neurology  Discharge Exam:   Vitals:   01/19/18 0438 01/19/18 0836  BP: 116/65 131/87  Pulse: 74 76  Resp: 18 17  Temp:  98.1 F (36.7 C)  SpO2:  98%   Vitals:   01/18/18 2014 01/18/18 2323 01/19/18 0438 01/19/18 0836  BP: 136/82 131/71 116/65 131/87  Pulse: 98 95 74 76  Resp:   18 17  Temp: 97.9 F (36.6 C) 98.5 F (36.9 C)  98.1 F (36.7 C)  TempSrc: Oral Oral  Oral  SpO2: 97% 97%  98%  Weight:      Height:        General exam: Appears calm and comfortable. Back to baseline  The results of significant diagnostics from this hospitalization (including imaging, microbiology, ancillary and laboratory) are listed below for reference.     Procedures and Diagnostic Studies:   Ct Head Wo Contrast  Result Date: 01/19/2018 CLINICAL DATA:  Initial evaluation for acute encephalopathy, seizure. EXAM: CT HEAD WITHOUT CONTRAST TECHNIQUE: Contiguous axial images were obtained from the base of the skull through the vertex without intravenous contrast. COMPARISON:  None available. FINDINGS: Brain: Cerebral volume within normal limits for patient age. No evidence for acute intracranial hemorrhage. No findings to suggest acute large vessel territory infarct. No mass lesion, midline shift, or mass effect. Ventricles are normal in size without evidence for hydrocephalus. No extra-axial fluid collection identified. Vascular: No hyperdense vessel identified. Skull: Scalp soft tissues demonstrate no acute abnormality. Calvarium intact. Sinuses/Orbits: Globes and orbital soft tissues within normal limits. Retention cyst present within the right maxillary sinus. Paranasal sinuses are otherwise clear. No mastoid effusion. IMPRESSION: Normal head CT.  No acute intracranial abnormality identified. Electronically Signed   By: Rise Mu M.D.   On:  01/19/2018 01:59   Dg Shoulder Left Port  Result Date: 01/18/2018 CLINICAL DATA:  Larey Seat today while having seizure, left shoulder pain EXAM: LEFT SHOULDER - 1 VIEW COMPARISON:  None. FINDINGS: On the single portable view obtained, the left humeral head appears to be in normal position. No fracture is seen. The left AC  joint appears normally aligned. IMPRESSION: Negative portable left shoulder film. Electronically Signed   By: Dwyane Dee M.D.   On: 01/18/2018 15:28     Labs:   Basic Metabolic Panel: Recent Labs  Lab 01/18/18 1158  NA 138  K 3.7  CL 104  CO2 24  GLUCOSE 120*  BUN 6  CREATININE 0.69  CALCIUM 9.3  MG 2.1   GFR Estimated Creatinine Clearance: 204.9 mL/min (by C-G formula based on SCr of 0.69 mg/dL). Liver Function Tests: Recent Labs  Lab 01/18/18 1158  AST 38  ALT 53*  ALKPHOS 64  BILITOT 0.5  PROT 7.5  ALBUMIN 4.0   No results for input(s): LIPASE, AMYLASE in the last 168 hours. No results for input(s): AMMONIA in the last 168 hours. Coagulation profile No results for input(s): INR, PROTIME in the last 168 hours.  CBC: Recent Labs  Lab 01/18/18 1158  WBC 10.0  NEUTROABS 7.9*  HGB 13.6  HCT 42.6  MCV 82.1  PLT 367   Cardiac Enzymes: No results for input(s): CKTOTAL, CKMB, CKMBINDEX, TROPONINI in the last 168 hours. BNP: Invalid input(s): POCBNP CBG: Recent Labs  Lab 01/18/18 1222  GLUCAP 124*   D-Dimer No results for input(s): DDIMER in the last 72 hours. Hgb A1c No results for input(s): HGBA1C in the last 72 hours. Lipid Profile No results for input(s): CHOL, HDL, LDLCALC, TRIG, CHOLHDL, LDLDIRECT in the last 72 hours. Thyroid function studies No results for input(s): TSH, T4TOTAL, T3FREE, THYROIDAB in the last 72 hours.  Invalid input(s): FREET3 Anemia work up No results for input(s): VITAMINB12, FOLATE, FERRITIN, TIBC, IRON, RETICCTPCT in the last 72 hours. Microbiology No results found for this or any previous visit (from the past 240 hour(s)).   Discharge Instructions:   Discharge Instructions    Diet regular   Complete by:  As directed    Discharge instructions   Complete by:  As directed    Per [facility] statutes, patients with seizures are not allowed to drive until  they have been seizure-free for six months. Use  caution when using heavy equipment or power tools. Avoid working on ladders or at heights. Take showers instead of baths. Ensure the water temperature is not too high on the home water heater. Do not go swimming alone. When caring for infants or small children, sit down when holding, feeding, or changing them to minimize risk of injury to the child in the event you have a seizure.  Also, Maintain good sleep hygiene. Avoid alcohol. Close follow up with neurology at [facility] Continue CPAP   Increase activity slowly   Complete by:  As directed      Allergies as of 01/19/2018   No Known Allergies     Medication List    TAKE these medications   clonazePAM 0.5 MG tablet Commonly known as:  KLONOPIN Take 1 tablet (0.5 mg total) by mouth 2 (two) times daily.   ibuprofen 200 MG tablet Commonly known as:  ADVIL,MOTRIN Take 400 mg by mouth every 6 (six) hours as needed for headache.   Lacosamide 100 MG Tabs Take 300 mg by mouth 2 (two) times daily.  levETIRAcetam 500 MG tablet Commonly known as:  KEPPRA Take 2,500 mg by mouth 2 (two) times daily.   multivitamin capsule Take 1 capsule by mouth daily.      Follow-up Information    Tisovec, Adelfa Koh, MD Follow up in 1 week(s).   Specialty:  Internal Medicine Contact information: [address] [place] [address] [phone]        August Saucer, MD. Schedule an appointment as soon as possible for a visit.   Specialty:  Psychiatry Contact information: [address] [facility] [address] [place] [address] [phone]            Time coordinating discharge: 25 min  Signed:  Joseph Art DO  [facility] 01/19/2018, 11:26 AM

## 2018-01-19 NOTE — Care Management Note (Signed)
Case Management Note  Patient Details  Name: Jonathan Hartman MRN: [id number] Date of Birth: March 02, 1986  Subjective/Objective:    Pt in with seizures. He is from home with his spouse.  DME: CPAP Pt denies issues with obtaining his medications.  Pt denies issues with transportation.                Action/Plan: Pt discharging home with self care. Wife able to provide supervision at home and transportation to home.  Expected Discharge Date:  01/19/18               Expected Discharge Plan:  Home/Self Care  In-House Referral:     Discharge planning Services     Post Acute Care Choice:    Choice offered to:     DME Arranged:    DME Agency:     HH Arranged:    HH Agency:     Status of Service:  Completed, signed off  If discussed at [employer] of Stay Meetings, dates discussed:    Additional Comments:  Kermit Balo, RN 01/19/2018, 11:57 AM

## 2018-01-19 NOTE — Progress Notes (Signed)
NURSING PROGRESS NOTE  Bill Yohn [id number] Discharge Data: 01/19/2018 3:12 PM Attending Provider: No att. providers found WUJ:[id number], Adelfa Koh, MD     [place] to be D/C'd Home per MD order.  Discussed with the patient the After Visit Summary and all questions fully answered. All IV's discontinued with no bleeding noted. All belongings returned to patient for patient to take home.   Last Vital Signs:  Blood pressure (!) 142/86, pulse 77, temperature 97.7 F (36.5 C), temperature source Oral, resp. rate 18, height 5\' 10"  (1.778 m), weight (!) 158.8 kg, SpO2 100 %.  Discharge Medication List Allergies as of 01/19/2018   No Known Allergies     Medication List    TAKE these medications   clonazePAM 0.5 MG tablet Commonly known as:  KLONOPIN Take 1 tablet (0.5 mg total) by mouth 2 (two) times daily.   ibuprofen 200 MG tablet Commonly known as:  ADVIL,MOTRIN Take 400 mg by mouth every 6 (six) hours as needed for headache.   Lacosamide 100 MG Tabs Take 300 mg by mouth 2 (two) times daily.   levETIRAcetam 500 MG tablet Commonly known as:  KEPPRA Take 2,500 mg by mouth 2 (two) times daily.   multivitamin capsule Take 1 capsule by mouth daily.

## 2020-05-05 ENCOUNTER — Other Ambulatory Visit: Payer: Self-pay | Admitting: Physician Assistant

## 2020-05-05 ENCOUNTER — Other Ambulatory Visit (HOSPITAL_COMMUNITY): Payer: Self-pay | Admitting: Physician Assistant

## 2020-05-05 DIAGNOSIS — G40209 Localization-related (focal) (partial) symptomatic epilepsy and epileptic syndromes with complex partial seizures, not intractable, without status epilepticus: Secondary | ICD-10-CM

## 2020-05-06 ENCOUNTER — Other Ambulatory Visit (HOSPITAL_COMMUNITY): Payer: Self-pay | Admitting: Physician Assistant

## 2020-05-06 DIAGNOSIS — G40209 Localization-related (focal) (partial) symptomatic epilepsy and epileptic syndromes with complex partial seizures, not intractable, without status epilepticus: Secondary | ICD-10-CM

## 2020-05-07 ENCOUNTER — Encounter (HOSPITAL_COMMUNITY): Payer: Self-pay

## 2020-05-07 ENCOUNTER — Ambulatory Visit (HOSPITAL_COMMUNITY): Payer: Managed Care, Other (non HMO)

## 2020-09-17 ENCOUNTER — Emergency Department (HOSPITAL_COMMUNITY): Payer: Managed Care, Other (non HMO)

## 2020-09-17 ENCOUNTER — Emergency Department (HOSPITAL_COMMUNITY)
Admission: EM | Admit: 2020-09-17 | Discharge: 2020-09-17 | Disposition: A | Discharge status: Home / Self Care | Payer: Managed Care, Other (non HMO) | Attending: Emergency Medicine | Admitting: Emergency Medicine

## 2020-09-17 ENCOUNTER — Other Ambulatory Visit: Payer: Self-pay

## 2020-09-17 ENCOUNTER — Encounter (HOSPITAL_COMMUNITY): Payer: Self-pay | Admitting: Emergency Medicine

## 2020-09-17 DIAGNOSIS — S0990XA Unspecified injury of head, initial encounter: Secondary | ICD-10-CM | POA: Diagnosis present

## 2020-09-17 DIAGNOSIS — W19XXXA Unspecified fall, initial encounter: Secondary | ICD-10-CM

## 2020-09-17 DIAGNOSIS — I10 Essential (primary) hypertension: Secondary | ICD-10-CM | POA: Diagnosis not present

## 2020-09-17 DIAGNOSIS — S0101XA Laceration without foreign body of scalp, initial encounter: Secondary | ICD-10-CM | POA: Diagnosis not present

## 2020-09-17 DIAGNOSIS — W01198A Fall on same level from slipping, tripping and stumbling with subsequent striking against other object, initial encounter: Secondary | ICD-10-CM | POA: Diagnosis not present

## 2020-09-17 DIAGNOSIS — Z79899 Other long term (current) drug therapy: Secondary | ICD-10-CM | POA: Insufficient documentation

## 2020-09-17 DIAGNOSIS — Z87891 Personal history of nicotine dependence: Secondary | ICD-10-CM | POA: Diagnosis not present

## 2020-09-17 DIAGNOSIS — R569 Unspecified convulsions: Secondary | ICD-10-CM

## 2020-09-17 HISTORY — DX: Essential (primary) hypertension: I10

## 2020-09-17 MED ORDER — LIDOCAINE-EPINEPHRINE (PF) 2 %-1:200000 IJ SOLN
10.0000 mL | Freq: Once | INTRAMUSCULAR | Status: AC
  Administered 2020-09-17: 10 mL
  Filled 2020-09-17: qty 20

## 2020-09-17 MED ORDER — ACETAMINOPHEN 325 MG PO TABS
650.0000 mg | ORAL_TABLET | Freq: Once | ORAL | Status: AC
  Administered 2020-09-17: 650 mg via ORAL
  Filled 2020-09-17: qty 2

## 2020-09-17 NOTE — ED Provider Notes (Signed)
Emergency Medicine Provider Triage Evaluation Note  Jonathan Hartman , a 35 y.o. male  was evaluated in triage.  Pt complains of head injury.  Patient slipped in the shower this morning hit the back of his head on the concrete floor.  He has a history of seizure disorder.  Patient reports that he had a 10-second "zone-out" that sounds similar to absence seizure.    He is to have a history of grand mal seizures, but now he only has absent seizures intermittently.  Most recent was 2 weeks ago.  He is on multiple medications.  He denies any LOC, blood thinners, numbness or weakness, dizziness, blurred vision or other visual deficits, nausea or vomiting, or other symptoms.  Clear mechanical fall, and remember slipping.  Review of Systems  Positive: Head injury, seizure en route.  Negative: N/V, visual changes, dizziness  Physical Exam  BP 140/72 (BP Location: Left Arm)   Pulse 77   Temp 98.6 F (37 C) (Oral)   Resp 15   SpO2 97%  Gen:   Awake, no distress   Resp:  Normal effort  MSK:   Moves extremities without difficulty  Other:  2 cm linear laceration on scalp, bleeding relatively well controlled.  Medical Decision Making  Medically screening exam initiated at 11:20 AM.  Appropriate orders placed.  Jakub Bogdanski was informed that the remainder of the evaluation will be completed by another provider, this initial triage assessment does not replace that evaluation, and the importance of remaining in the ED until their evaluation is complete.     Lorelee New, PA-C 09/17/20 1127    Linwood Dibbles, MD 09/18/20 [phone]

## 2020-09-17 NOTE — ED Provider Notes (Signed)
[facility] EMERGENCY DEPARTMENT Provider Note   CSN: [id number] Arrival date & time: 09/17/20  1052     History Chief Complaint  Patient presents with   Laceration    Jonathan Hartman is a 35 y.o. male.  Patient with history of seizure disorder presents the emergency department today for evaluation of a head injury after a fall this morning.  Patient slipped in the shower, falling backwards and striking the back of his head on the ground.  He denies consciousness.  He states that he was able to get back into the shower and wash out the wound.  He reports headache.  He had bleeding from an abrasion sustained at the time of the injury.  Patient was driven to the ED by family.  They report that he had a brief partial seizure, described as a staring spell, lasting a few seconds.  This is typical for him.  He reports only 2 grand mal seizures over the past several years.  The onset of this condition was acute. The course is constant. Aggravating factors: none. Alleviating factors: none.        Past Medical History:  Diagnosis Date   Hypertension    OSA on CPAP    Seizure (HCC) ~ 2012- 01/18/2018   "don't know why he has them; until today, he hadn't had one in ~ 3 yrs" (01/18/2018)    Patient Active Problem List   Diagnosis Date Noted   Seizure (HCC) 01/18/2018    Past Surgical History:  Procedure Laterality Date   NO PAST SURGERIES         Family History  Problem Relation Age of Onset   Hypertension Mother    Hypertension Father     Social History   Tobacco Use   Smoking status: Former    Pack years: 0.00    Types: Cigarettes   Smokeless tobacco: Never   Tobacco comments:    01/18/2018 "not since high school"  Vaping Use   Vaping Use: Never used  Substance Use Topics   Alcohol use: Yes    Comment: 01/08/2018 "might have 5 drinks/month; usually beer"   Drug use: Never    Home Medications Prior to Admission medications   Medication Sig Start  Date End Date Taking? Authorizing Provider  FYCOMPA 8 MG tablet Take 8 mg by mouth at bedtime.   Yes [provider]  ibuprofen (ADVIL,MOTRIN) 200 MG tablet Take 400 mg by mouth every 6 (six) hours as needed for mild pain (or headaches).   Yes [provider]  Lacosamide 100 MG TABS Take 300 mg by mouth 2 (two) times daily. 10/27/17 09/17/20 Yes [provider]  levETIRAcetam (KEPPRA) 500 MG tablet Take 2,500 mg by mouth 2 (two) times daily. 10/27/17 09/17/20 Yes [provider]  losartan (COZAAR) 100 MG tablet Take 100 mg by mouth at bedtime.   Yes [provider]    Allergies    Patient has no known allergies.  Review of Systems   Review of Systems  Constitutional:  Negative for fever.  HENT:  Negative for congestion, dental problem, rhinorrhea and sinus pressure.   Eyes:  Negative for photophobia, discharge, redness and visual disturbance.  Respiratory:  Negative for shortness of breath.   Cardiovascular:  Negative for chest pain.  Gastrointestinal:  Negative for nausea and vomiting.  Musculoskeletal:  Negative for gait problem, neck pain and neck stiffness.  Skin:  Positive for wound. Negative for rash.  Neurological:  Positive for  seizures and headaches. Negative for syncope, speech difficulty, weakness, light-headedness and numbness.  Psychiatric/Behavioral:  Negative for confusion.    Physical Exam Updated Vital Signs BP 129/79 (BP Location: Left Arm)   Pulse 64   Temp 98.6 F (37 C) (Oral)   Resp 16   Ht 5\' 11"  (1.803 m)   Wt (!) 158.8 kg   SpO2 99%   BMI 48.82 kg/m   Physical Exam Vitals and nursing note reviewed.  Constitutional:      Appearance: He is well-developed.  HENT:     Head: Normocephalic. No raccoon eyes or Battle's sign.     Comments: 1cm superfical abrasion/laceration occipital scalp, minimal oozing, wound is not gaping.     Right Ear: Tympanic membrane, ear canal and external ear normal. No hemotympanum.      Left Ear: Tympanic membrane, ear canal and external ear normal. No hemotympanum.     Nose: Nose normal.  Eyes:     General: Lids are normal.     Conjunctiva/sclera: Conjunctivae normal.     Pupils: Pupils are equal, round, and reactive to light.     Comments: No visible hyphema  Cardiovascular:     Rate and Rhythm: Normal rate and regular rhythm.  Pulmonary:     Effort: Pulmonary effort is normal.     Breath sounds: Normal breath sounds.  Abdominal:     Palpations: Abdomen is soft.     Tenderness: There is no abdominal tenderness.  Musculoskeletal:        General: Normal range of motion.     Cervical back: Normal range of motion and neck supple. No tenderness or bony tenderness.     Thoracic back: No tenderness or bony tenderness.     Lumbar back: No tenderness or bony tenderness.  Skin:    General: Skin is warm and dry.  Neurological:     Mental Status: He is alert and oriented to person, place, and time.     GCS: GCS eye subscore is 4. GCS verbal subscore is 5. GCS motor subscore is 6.     Cranial Nerves: No cranial nerve deficit.     Sensory: No sensory deficit.     Coordination: Coordination normal.     Deep Tendon Reflexes: Reflexes are normal and symmetric.    ED Results / Procedures / Treatments   Labs (all labs ordered are listed, but only abnormal results are displayed) Labs Reviewed - No data to display  EKG None  Radiology No results found.  Procedures Procedures   Medications Ordered in ED Medications  lidocaine-EPINEPHrine (XYLOCAINE W/EPI) 2 %-1:200000 (PF) injection 10 mL (10 mLs Infiltration Given 09/17/20 1549)  acetaminophen (TYLENOL) tablet 650 mg (650 mg Oral Given 09/17/20 1549)    ED Course  I have reviewed the triage vital signs and the nursing notes.  Pertinent labs & imaging results that were available during my care of the patient were reviewed by me and considered in my medical decision making (see chart for details).  Patient seen and  examined. Head CT ordered.   Vital signs reviewed and are as follows: BP 129/79 (BP Location: Left Arm)   Pulse 64   Temp 98.6 F (37 C) (Oral)   Resp 16   Ht 5\' 11"  (1.803 m)   Wt (!) 158.8 kg   SpO2 99%   BMI 48.82 kg/m   Wound was anesthetized with 2% lidocaine with epinephrine.  Wound was then cleaned with sterile gauze and saline.  Wound found  to be very superficial.  It does not open.  I feel that this would likely not benefit from staples and will heal well with regular wound care and protection.  Patient is in agreement.  Awaiting CT results.  Discussed wound care.   4:30 PM CT neg for intracranial injury.   Patient was counseled on head injury precautions and symptoms that should indicate their return to the ED.  These include severe worsening headache, vision changes, confusion, loss of consciousness, trouble walking, nausea & vomiting, or weakness/tingling in extremities.    Patient to continue antiepileptics and follow-up as planned.       MDM Rules/Calculators/A&P                          Fall: Sounds mechanical, no loss of consciousness.  Head CT negative.  Scalp laceration: Very superficial, no gaping.  No indication for closure.  Wound was cleaned with sterile gauze and normal saline.  Seizure disorder: Patient on multiple medications, seizures seem to be at baseline.  It is not uncommon for him to have breakthrough partial seizures.  Again CT is negative.  No signs of intracranial bleeding as an exacerbating factor.   Final Clinical Impression(s) / ED Diagnoses Final diagnoses:  Laceration of scalp, initial encounter  Injury of head, initial encounter  Fall, initial encounter  Partial seizure [facility])    Rx / DC Orders ED Discharge Orders     None        Renne Crigler, PA-C 09/17/20 1634    Little, Ambrose Finland, MD 09/17/20 1836

## 2020-09-17 NOTE — ED Notes (Signed)
Reviewed discharge instructions with patient and support person. Follow-up care reviewed. Patient and support person verbalized understanding. Patient A&Ox4, VSS, and ambulatory with steady gait upon discharge.  

## 2020-09-17 NOTE — Discharge Instructions (Signed)
Please read and follow all provided instructions.  Your diagnoses today include:  1. Laceration of scalp, initial encounter   2. Injury of head, initial encounter   3. Fall, initial encounter   4. Partial seizure (HCC)     Tests performed today include: CT scan of your head that did not show any serious injury. Vital signs. See below for your results today.   Medications prescribed:  None  Take any prescribed medications only as directed.  Home care instructions:  Follow any educational materials contained in this packet.  BE VERY CAREFUL not to take multiple medicines containing Tylenol (also called acetaminophen). Doing so can lead to an overdose which can damage your liver and cause liver failure and possibly death.   Follow-up instructions: Please follow-up with your primary care provider as needed.   Return instructions:  SEEK IMMEDIATE MEDICAL ATTENTION IF: There is confusion or drowsiness (although children frequently become drowsy after injury).  You cannot awaken the injured person.  You have more than one episode of vomiting.  You notice dizziness or unsteadiness which is getting worse, or inability to walk.  You have convulsions or unconsciousness.  You experience severe, persistent headaches not relieved by Tylenol. You cannot use arms or legs normally.  There are changes in pupil sizes. (This is the black center in the colored part of the eye)  There is clear or bloody discharge from the nose or ears.  You have change in speech, vision, swallowing, or understanding.  Localized weakness, numbness, tingling, or change in bowel or bladder control. You have any other emergent concerns.  Additional Information: You have had a head injury which does not appear to require admission at this time.  Your vital signs today were: BP 129/79 (BP Location: Left Arm)   Pulse 64   Temp 98.6 F (37 C) (Oral)   Resp 16   Ht 5\' 11"  (1.803 m)   Wt (!) 158.8 kg   SpO2 99%    BMI 48.82 kg/m  If your blood pressure (BP) was elevated above 135/85 this visit, please have this repeated by your doctor within one month. --------------

## 2020-09-17 NOTE — ED Triage Notes (Signed)
Patient here after slipping in the shower this morning and hitting back of his head on concrete floor, hemorrhage controlled, does not think he lost  consciousness Patient has history of epilepsy that is well controlled with medications but reports a focal seizure while riding to [facility]. Patient alert, oriented, and in no apparent distress at this time.

## 2022-12-23 IMAGING — CT CT HEAD W/O CM
4 series · 15 of 47 positions shown, 17 images · non-contrast
Comparison: 01/19/2018

CLINICAL DATA: Fall, seizure after head strike

EXAM:
CT HEAD WITHOUT CONTRAST
TECHNIQUE: Contiguous axial images were obtained from the base of the skull
through the vertex without intravenous contrast.

[Series 3: head without · axial · non-contrast · 0.55mm/px · z∈[-167,-37]mm · 7 of 36 slices shown, 9 images]
[im 5/36  brain]
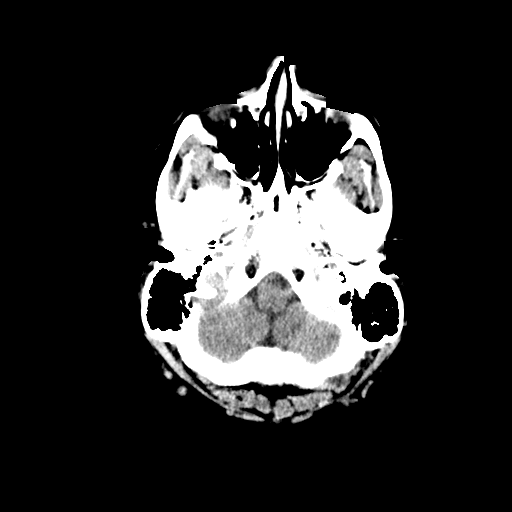
[im 5/36  bone]
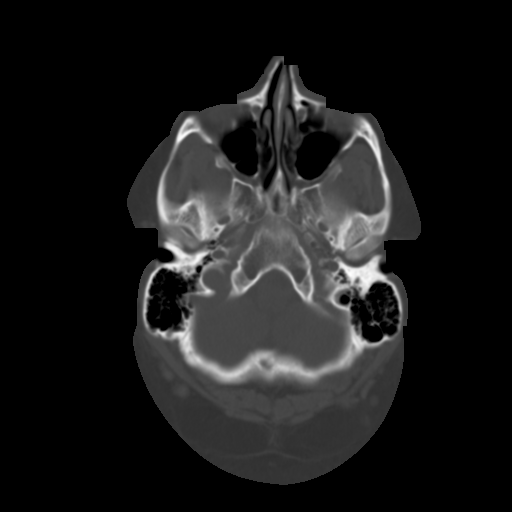
[im 9/36  brain]
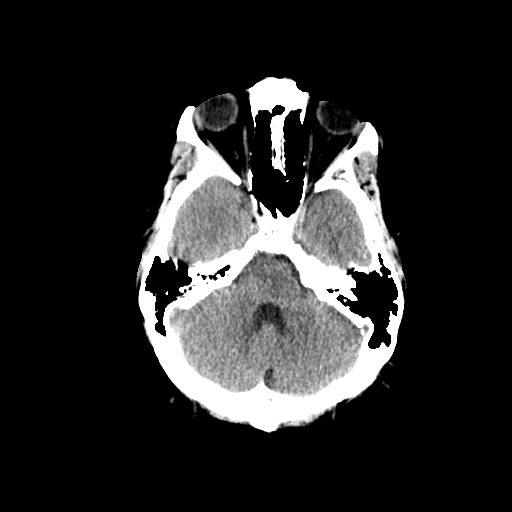
[im 14/36  brain]
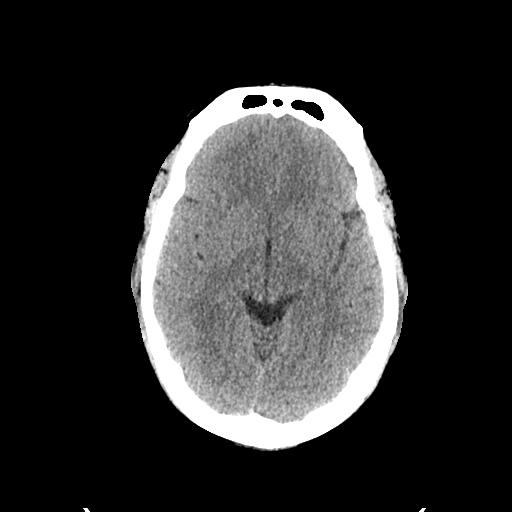
[im 18/36  brain]
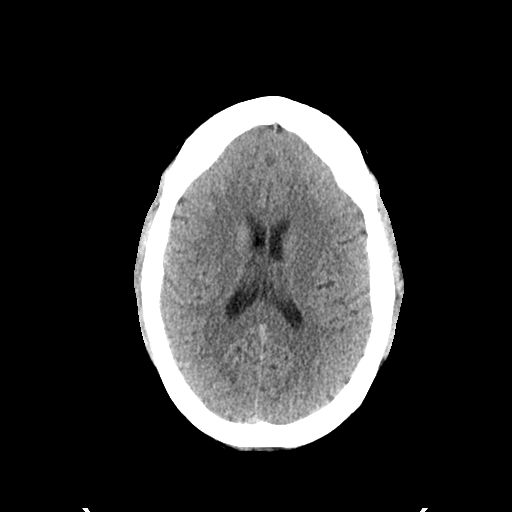
[im 22/36  brain]
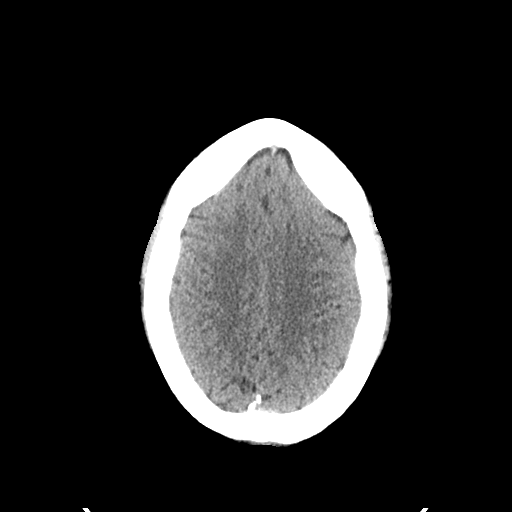
[im 22/36  bone]
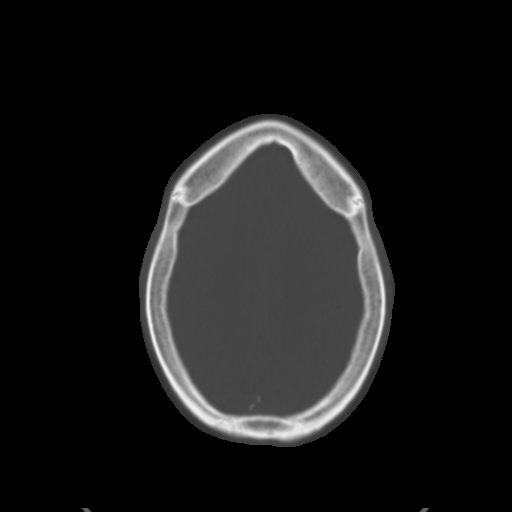
[im 27/36  brain]
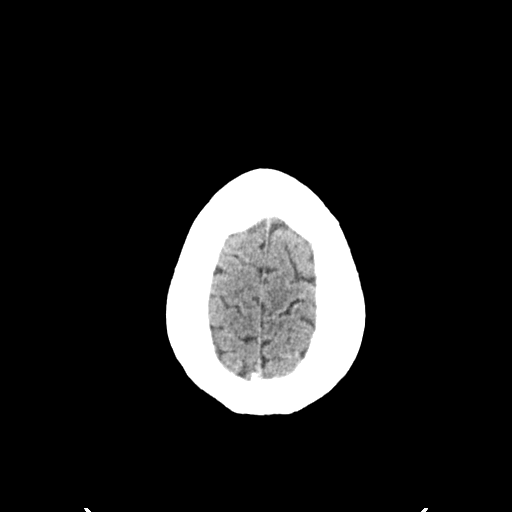
[im 31/36  brain]
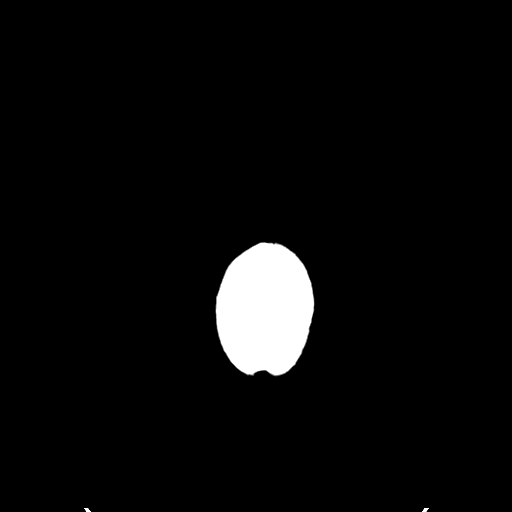

[Series 4: head bone · axial · 0.55mm/px · z∈[-171,-153]mm · 2 of 90 slices shown]
[im 9/90  bone]
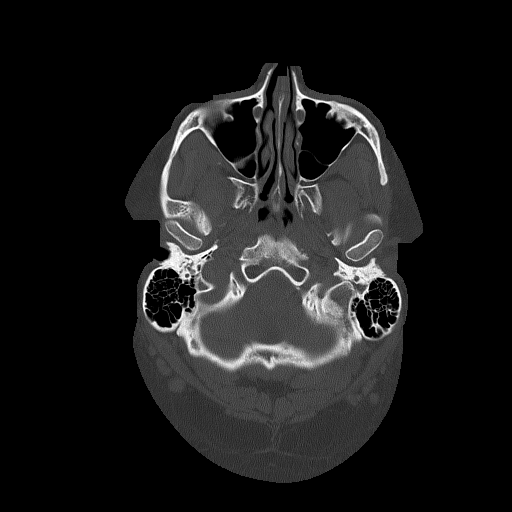
[im 18/90  bone]
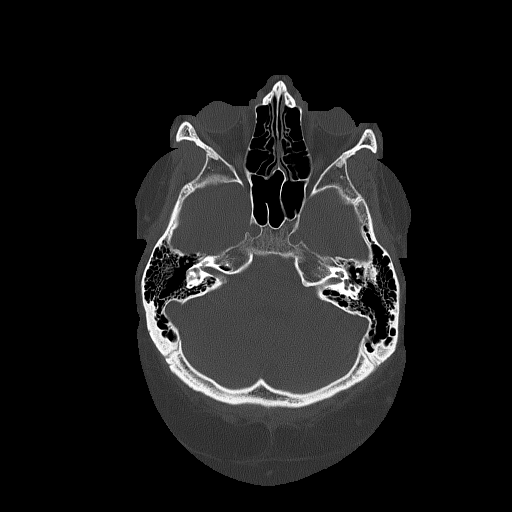

[Series 5: head without cor · coronal · non-contrast · 0.34mm/px · 3 of 74 slices shown]
[im 25/74  brain]
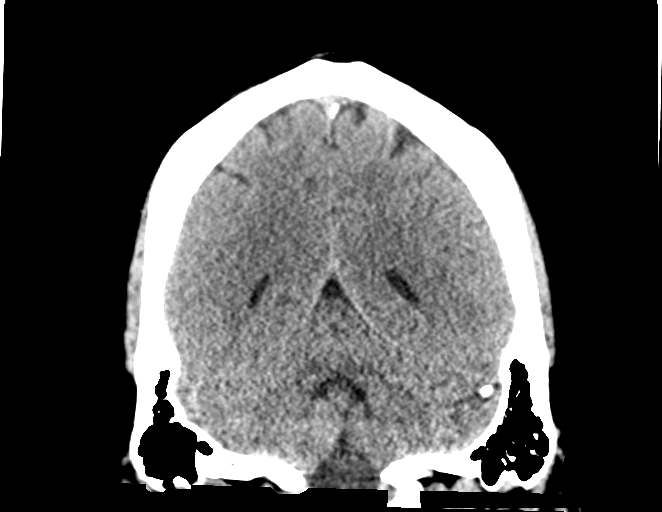
[im 33/74  brain]
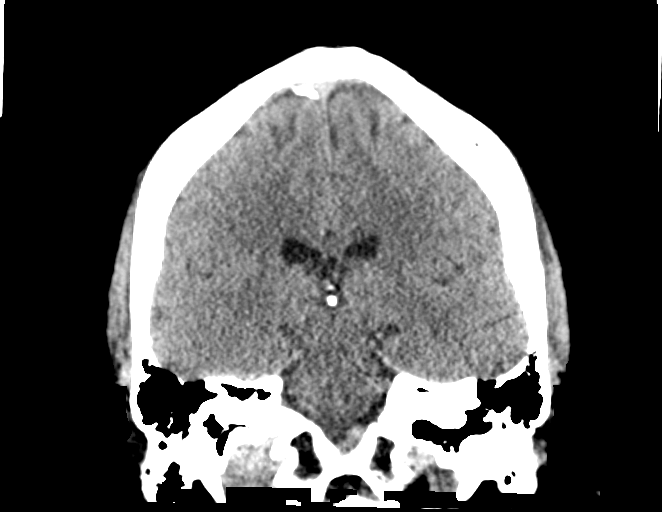
[im 41/74  brain]
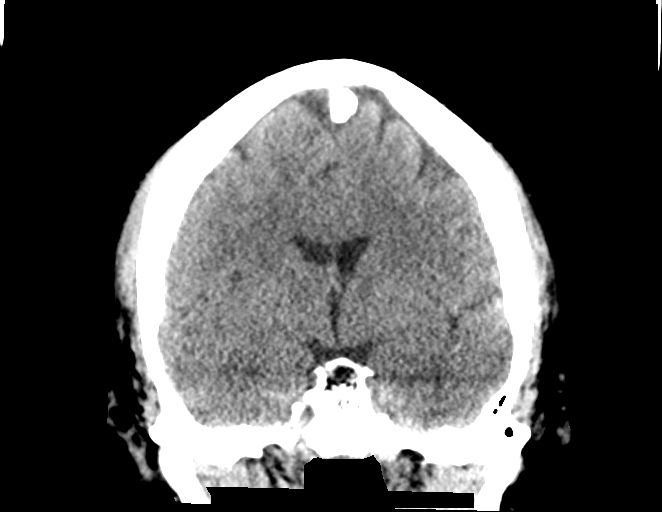

[Series 8: head without sag · sagittal · non-contrast · 0.34mm/px · 3 of 56 slices shown]
[im 19/56  brain]
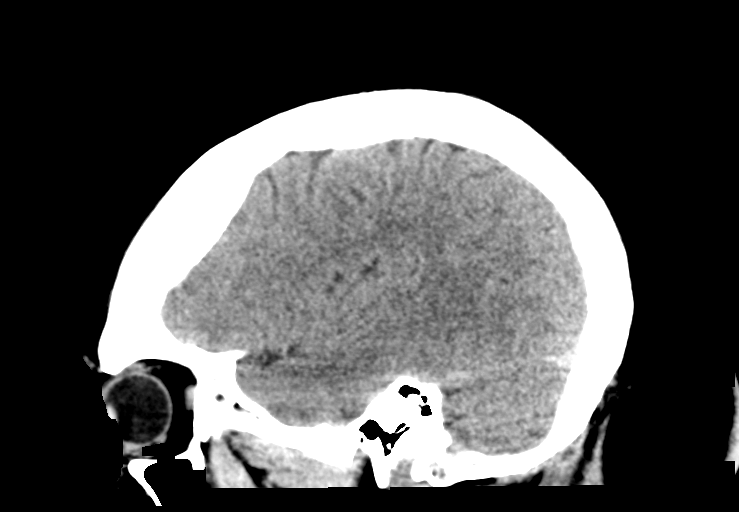
[im 28/56  brain]
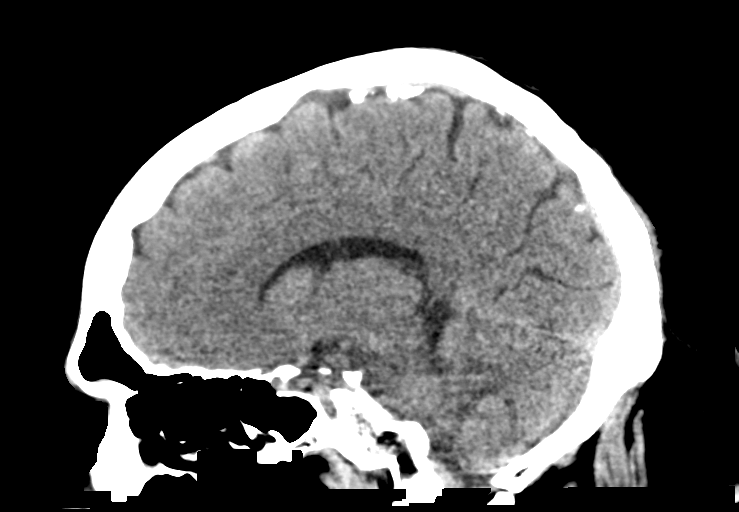
[im 37/56  brain]
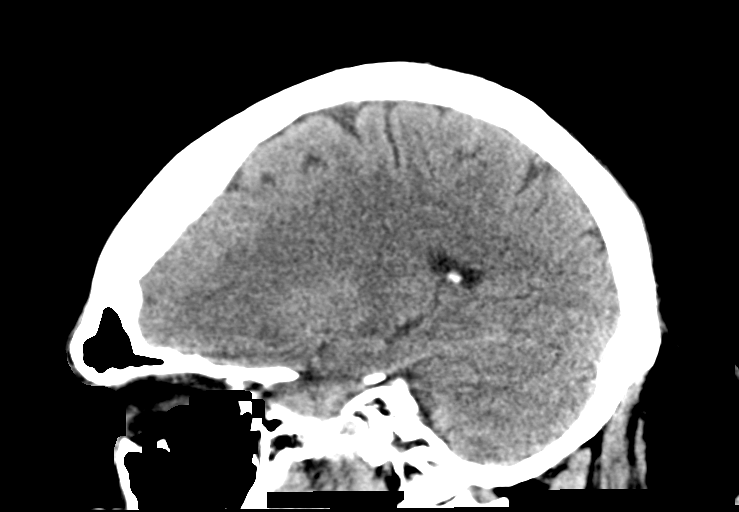

[15 of 47 positions shown; findings below may reference images not displayed]

FINDINGS: Brain: No evidence of acute infarction, hemorrhage, hydrocephalus,
extra-axial collection, visible mass lesion or mass effect. Basal
cisterns are patent. Midline intracranial structures are
unremarkable. Cerebellar tonsils are normally positioned.

Vascular: No hyperdense vessel or unexpected calcification.

Skull: Posterior midline scalp swelling and laceration with punctate
focus of soft tissue gas. No subjacent calvarial fracture or acute
osseous injury.

Sinuses/Orbits: Retention cyst versus mural thickening in the right
maxillary sinus. Remaining paranasal sinuses and mastoid air cells
are predominantly clear. Included orbital structures are
unremarkable.

Other: None
IMPRESSION: Posterior midline scalp swelling and possible laceration. No
subjacent calvarial fracture.

No acute intracranial abnormality.
